# Patient Record
Sex: Female | Born: 1980 | Race: Black or African American | Hispanic: No | Marital: Single | Smoking: Current every day smoker
Health system: Southern US, Community
[De-identification: ages and names within clinical notes are randomized; demographics above are authoritative.]

---

## 2016-08-08 ENCOUNTER — Emergency Department (HOSPITAL_COMMUNITY): Payer: Medicaid Other

## 2016-08-08 ENCOUNTER — Emergency Department (HOSPITAL_COMMUNITY)
Admission: EM | Admit: 2016-08-08 | Discharge: 2016-08-08 | Disposition: A | Discharge status: Home / Self Care | Payer: Medicaid Other | Attending: Emergency Medicine | Admitting: Emergency Medicine

## 2016-08-08 ENCOUNTER — Encounter (HOSPITAL_COMMUNITY): Payer: Self-pay | Admitting: Neurology

## 2016-08-08 DIAGNOSIS — R1030 Lower abdominal pain, unspecified: Secondary | ICD-10-CM

## 2016-08-08 DIAGNOSIS — K5900 Constipation, unspecified: Secondary | ICD-10-CM | POA: Diagnosis not present

## 2016-08-08 DIAGNOSIS — F17200 Nicotine dependence, unspecified, uncomplicated: Secondary | ICD-10-CM | POA: Insufficient documentation

## 2016-08-08 DIAGNOSIS — R1031 Right lower quadrant pain: Secondary | ICD-10-CM | POA: Diagnosis present

## 2016-08-08 LAB — COMPREHENSIVE METABOLIC PANEL
ALBUMIN: 4.1 g/dL (ref 3.5–5.0)
ALT: 22 U/L (ref 14–54)
AST: 25 U/L (ref 15–41)
Alkaline Phosphatase: 66 U/L (ref 38–126)
Anion gap: 7 (ref 5–15)
BUN: 9 mg/dL (ref 6–20)
CHLORIDE: 104 mmol/L (ref 101–111)
CO2: 27 mmol/L (ref 22–32)
CREATININE: 0.95 mg/dL (ref 0.44–1.00)
Calcium: 9.6 mg/dL (ref 8.9–10.3)
GFR calc Af Amer: >60 mL/min (ref >60)
GLUCOSE: 94 mg/dL (ref 65–99)
POTASSIUM: 3.8 mmol/L (ref 3.5–5.1)
Sodium: 138 mmol/L (ref 135–145)
Total Bilirubin: 0.5 mg/dL (ref 0.3–1.2)
Total Protein: 7.7 g/dL (ref 6.5–8.1)

## 2016-08-08 LAB — CBC
HEMATOCRIT: 39.2 % (ref 36.0–46.0)
Hemoglobin: 12.9 g/dL (ref 12.0–15.0)
MCH: 25.4 pg — ABNORMAL LOW (ref 26.0–34.0)
MCHC: 32.9 g/dL (ref 30.0–36.0)
MCV: 77.3 fL — AB (ref 78.0–100.0)
PLATELETS: 246 10*3/uL (ref 150–400)
RBC: 5.07 MIL/uL (ref 3.87–5.11)
RDW: 15.6 % — AB (ref 11.5–15.5)
WBC: 6.3 10*3/uL (ref 4.0–10.5)

## 2016-08-08 LAB — LIPASE, BLOOD: LIPASE: 27 U/L (ref 11–51)

## 2016-08-08 LAB — I-STAT BETA HCG BLOOD, ED (MC, WL, AP ONLY): I-stat hCG, quantitative: <5 m[IU]/mL (ref <5)

## 2016-08-08 NOTE — ED Notes (Signed)
Patient changing into gown/pelvic cart at bedside if needed.

## 2016-08-08 NOTE — Discharge Instructions (Signed)
Drink plenty of fluids Take Miralax over the counter Follow up with OB

## 2016-08-08 NOTE — ED Notes (Signed)
Patient transported to Ultrasound 

## 2016-08-08 NOTE — ED Provider Notes (Signed)
[facility]-EM[facility]ENCY DEPT Provider Note   CSN: [id number] Arrival date & time: 08/08/16  1127  By signing my name below, I, Emmanuella Mensah, attest that this documentation has been prepared under the direction and in the presence of Wells FargoKelly Jilian West, PA-C. Electronically Signed: Angelene GiovanniEmmanuella Mensah, ED Scribe. 08/08/16. 2:28 PM.   History   Chief Complaint Chief Complaint  Patient presents with  . Abdominal Pain  . Vaginal Bleeding    HPI Comments: Julia Miller is a 35 y.o. female who presents to the Emergency Department complaining of gradual onset, persistent 6/10 right lower abdominal pain that radiates to her back onset yesterday morning. She reports associated light vaginal bleeding with one nickel sized clot yesterday and persistent nausea for one week. She adds that she normally has 2 BMs a day but lately, she has only been having one BM a day and it is hard and she has to strain. She explains that she had a positive home pregnancy test on October 30th and had vaginal bleeding for 1.5 days in November but she attributed those symptoms to her pregnancy and was not evaluated. Pt's LMP was in September 2017. No alleviating factors noted. Pt has not tried any medications PTA. She has NKDA. She reports a hx of miscarriage. She denies any known fevers, chills, vomiting, diarrhea, vaginal discharge, dysuria, urinary frequency, or any other symptoms. Pt has upcoming OBGYN appointment on 08/10/16.   The history is provided by the patient. No language interpreter was used.    History reviewed. No pertinent past medical history.  There are no active problems to display for this patient.   History reviewed. No pertinent surgical history.  OB History    No data available       Home Medications    Prior to Admission medications   Not on File    Family History No family history on file.  Social History Social History  Substance Use Topics  . Smoking status: Current Every Day Smoker    . Smokeless tobacco: Never Used  . Alcohol use Yes     Allergies   Patient has no known allergies.   Review of Systems Review of Systems  Constitutional: Negative for chills and fever.  Gastrointestinal: Positive for abdominal pain, constipation and nausea. Negative for diarrhea and vomiting.  Genitourinary: Positive for vaginal bleeding. Negative for dysuria, frequency, hematuria and vaginal discharge.  All other systems reviewed and are negative.    Physical Exam Updated Vital Signs BP 115/75 (BP Location: Left Arm)   Pulse 89   Temp 98.7 F (37.1 C) (Oral)   Resp 16   Ht 5\' 8"  (1.727 m)   Wt 200 lb (90.7 kg)   LMP 05/09/2016   SpO2 98%   BMI 30.41 kg/m   Physical Exam  Constitutional: She is oriented to person, place, and time. She appears well-developed and well-nourished. No distress.  HENT:  Head: Normocephalic and atraumatic.  Eyes: Conjunctivae and EOM are normal.  Neck: Neck supple.  Cardiovascular: Normal rate and regular rhythm.  Exam reveals no gallop and no friction rub.   No murmur heard. Pulmonary/Chest: Effort normal. No respiratory distress. She has no wheezes. She has no rales. She exhibits no tenderness.  Abdominal: Soft. Bowel sounds are normal. She exhibits no distension and no mass. There is tenderness. There is no rebound and no guarding. No hernia.  Mild right lower quadrant tenderness  Genitourinary:  Genitourinary Comments:  Normal external genitalia. No pain with speculum insertion. Closed cervical os  with normal appearance - no rash or lesions. Small amount of blood in vaginal vault consistent with a period. On bimanual examination no adnexal tenderness or cervical motion tenderness. No inguinal lymphadenopathy or inguinal hernia noted.Chaperone present during exam.    Musculoskeletal: Normal range of motion.  Neurological: She is alert and oriented to person, place, and time.  Skin: Skin is warm and dry.  Psychiatric: She has a normal  mood and affect. Her behavior is normal.  Nursing note and vitals reviewed.    ED Treatments / Results  DIAGNOSTIC STUDIES: Oxygen Saturation is 98% on RA, normal by my interpretation.    COORDINATION OF CARE: 2:25 PM- Pt advised of plan for treatment and pt agrees. Pt informed of her pregnancy test results and lab results. She will receive pelvic examination, US pelvis, US transvaginal, and US doppler for further evaluation.    Labs (all labs ordered are listed, but only abnormal results are displayed) Labs Reviewed  CBC - Abnormal; Notable for the following:       Result Value   MCV 77.3 (*)    MCH 25.4 (*)    RDW 15.6 (*)    All other components within normal limits  LIPASE, BLOOD  COMPREHENSIVE METABOLIC PANEL  URINALYSIS, ROUTINE W REFLEX MICROSCOPIC  I-STAT BETA HCG BLOOD, ED ([facility], [facility], [facility] ONLY)    EKG  EKG Interpretation None       Radiology US Transvaginal Non-ob  Result Date: 08/08/2016 CLINICAL DATA:  Acute right pelvic pain for 1 day. Clinical suspicion for ovarian torsion. EXAM: TRANSABDOMINAL AND TRANSVAGINAL ULTRASOUND OF PELVIS DOPPLER ULTRASOUND OF OVARIES TECHNIQUE: Both transabdominal and transvaginal ultrasound examinations of the pelvis were performed. Transabdominal technique was performed for global imaging of the pelvis including uterus, ovaries, adnexal regions, and pelvic cul-de-sac. It was necessary to proceed with endovaginal exam following the transabdominal exam to visualize the endometrium and ovaries. Color and duplex Doppler ultrasound was utilized to evaluate blood flow to the ovaries. COMPARISON:  None. FINDINGS: Uterus Measurements: 10.0 x 5.3 x 6.0 cm. No fibroids or other mass visualized. Endometrium Thickness: 4 mm.  No focal abnormality visualized. Right ovary Measurements: 3.4 x 1.6 x 3.8 cm. Normal appearance/no adnexal mass. Left ovary Measurements: 3.1 x 1.7 by 2.2 cm. Normal appearance/no adnexal mass. Pulsed Doppler evaluation of both  ovaries demonstrates normal low-resistance arterial and venous waveforms. Other findings No abnormal free fluid. IMPRESSION: Normal appearance of uterus and both ovaries. No pelvic mass or other significant abnormality identified. No sonographic evidence for ovarian torsion. Electronically Signed   By: Myles Rosenthal M.D.   On: 08/08/2016 16:35   US Pelvis Complete  Result Date: 08/08/2016 CLINICAL DATA:  Acute right pelvic pain for 1 day. Clinical suspicion for ovarian torsion. EXAM: TRANSABDOMINAL AND TRANSVAGINAL ULTRASOUND OF PELVIS DOPPLER ULTRASOUND OF OVARIES TECHNIQUE: Both transabdominal and transvaginal ultrasound examinations of the pelvis were performed. Transabdominal technique was performed for global imaging of the pelvis including uterus, ovaries, adnexal regions, and pelvic cul-de-sac. It was necessary to proceed with endovaginal exam following the transabdominal exam to visualize the endometrium and ovaries. Color and duplex Doppler ultrasound was utilized to evaluate blood flow to the ovaries. COMPARISON:  None. FINDINGS: Uterus Measurements: 10.0 x 5.3 x 6.0 cm. No fibroids or other mass visualized. Endometrium Thickness: 4 mm.  No focal abnormality visualized. Right ovary Measurements: 3.4 x 1.6 x 3.8 cm. Normal appearance/no adnexal mass. Left ovary Measurements: 3.1 x 1.7 by 2.2 cm. Normal appearance/no adnexal mass. Pulsed  Doppler evaluation of both ovaries demonstrates normal low-resistance arterial and venous waveforms. Other findings No abnormal free fluid. IMPRESSION: Normal appearance of uterus and both ovaries. No pelvic mass or other significant abnormality identified. No sonographic evidence for ovarian torsion. Electronically Signed   By: Myles RosenthalJohn  Stahl M.D.   On: 08/08/2016 16:35   [place] Art/ven Flow Abd Pelv Doppler  Result Date: 08/08/2016 CLINICAL DATA:  Acute right pelvic pain for 1 day. Clinical suspicion for ovarian torsion. EXAM: TRANSABDOMINAL AND TRANSVAGINAL ULTRASOUND OF  PELVIS DOPPLER ULTRASOUND OF OVARIES TECHNIQUE: Both transabdominal and transvaginal ultrasound examinations of the pelvis were performed. Transabdominal technique was performed for global imaging of the pelvis including uterus, ovaries, adnexal regions, and pelvic cul-de-sac. It was necessary to proceed with endovaginal exam following the transabdominal exam to visualize the endometrium and ovaries. Color and duplex Doppler ultrasound was utilized to evaluate blood flow to the ovaries. COMPARISON:  None. FINDINGS: Uterus Measurements: 10.0 x 5.3 x 6.0 cm. No fibroids or other mass visualized. Endometrium Thickness: 4 mm.  No focal abnormality visualized. Right ovary Measurements: 3.4 x 1.6 x 3.8 cm. Normal appearance/no adnexal mass. Left ovary Measurements: 3.1 x 1.7 by 2.2 cm. Normal appearance/no adnexal mass. Pulsed Doppler evaluation of both ovaries demonstrates normal low-resistance arterial and venous waveforms. Other findings No abnormal free fluid. IMPRESSION: Normal appearance of uterus and both ovaries. No pelvic mass or other significant abnormality identified. No sonographic evidence for ovarian torsion. Electronically Signed   By: Myles RosenthalJohn  Stahl M.D.   On: 08/08/2016 16:35    Procedures Procedures (including critical care time)  Medications Ordered in ED Medications - No data to display   Initial Impression / Assessment and Plan / ED Course  Terance HartKelly Allyiah Gartner, PA-C has reviewed the triage vital signs and the nursing notes.  Pertinent labs & imaging results that were available during my care of the patient were reviewed by me and considered in my medical decision making (see chart for details).  Clinical Course    Patient is nontoxic, nonseptic appearing, in no apparent distress. Labs, imaging and vitals reviewed. Vitals are normal. Patient does not meet the SIRS or Sepsis criteria. She has a soft, minimally tender abdomen. No indication of appendicitis, bowel obstruction, bowel perforation,  cholecystitis, diverticulitis, PID or ectopic pregnancy.  Negative pregnancy test. Labs are unremarkable. US negative for acute pathology. Return precautions discussed. Patient expresses understanding and agrees with plan.  Final Clinical Impressions(s) / ED Diagnoses   Final diagnoses:  Lower abdominal pain  Constipation, unspecified constipation type    New Prescriptions There are no discharge medications for this patient.  I personally performed the services described in this documentation, which was scribed in my presence. The recorded information has been reviewed and is accurate.    Bethel BornKelly Marie Glennie Rodda, PA-C 08/09/16 [id number]    Lyndal Pulleyaniel Knott, MD 08/09/16 [id number]

## 2016-08-08 NOTE — ED Notes (Signed)
Pt states she had a positive pregnancy test in the end of October and has an OB appt on 12/20.

## 2016-08-08 NOTE — ED Triage Notes (Addendum)
Pt here with right sided abdominal pain x 2 days, with vaginal bleeding since yesterday. LMP was September, had positive pregnancy test. Is not sure if she is having a miscarriage. Has been having nausea. Pt in NAD, resting comfortably in chair.

## 2017-10-03 ENCOUNTER — Encounter (HOSPITAL_COMMUNITY): Payer: Self-pay | Admitting: Family Medicine

## 2017-10-03 ENCOUNTER — Ambulatory Visit (INDEPENDENT_AMBULATORY_CARE_PROVIDER_SITE_OTHER): Payer: PRIVATE HEALTH INSURANCE

## 2017-10-03 ENCOUNTER — Ambulatory Visit (HOSPITAL_COMMUNITY)
Admission: EM | Admit: 2017-10-03 | Discharge: 2017-10-03 | Disposition: A | Discharge status: Home / Self Care | Payer: PRIVATE HEALTH INSURANCE | Attending: Family Medicine | Admitting: Family Medicine

## 2017-10-03 DIAGNOSIS — M25531 Pain in right wrist: Secondary | ICD-10-CM

## 2017-10-03 DIAGNOSIS — S63501A Unspecified sprain of right wrist, initial encounter: Secondary | ICD-10-CM | POA: Diagnosis not present

## 2017-10-03 MED ORDER — DICLOFENAC SODIUM 75 MG PO TBEC: 75.0000 mg | DELAYED_RELEASE_TABLET | Freq: Two times a day (BID) | ORAL | 0 refills | Status: AC

## 2017-10-03 NOTE — ED Provider Notes (Signed)
  [facility]   [id number] 10/03/17 Arrival Time: 1127  ASSESSMENT & PLAN:  1. Sprain of right wrist, initial encounter    Imaging: Dg Wrist Complete Right  Result Date: 10/03/2017 CLINICAL DATA:  Pain following fall EXAM: RIGHT WRIST - COMPLETE 3+ VIEW COMPARISON:  None. FINDINGS: Frontal, oblique, lateral, and ulnar deviation scaphoid images were obtained. There is no evident fracture or dislocation. Joint spaces appear normal. No erosive change. IMPRESSION: No fracture or dislocation.  No evident arthropathy. Electronically Signed   By: Bretta BangWilliam  Woodruff III M.D.   On: 10/03/2017 12:07   Meds ordered this encounter  Medications  . diclofenac (VOLTAREN) 75 MG EC tablet    Sig: Take 1 tablet (75 mg total) by mouth 2 (two) times daily.    Dispense:  14 tablet    Refill:  0   Ice. ROM as tolerated. Will f/u if not improving over the next several days.  Reviewed expectations re: course of current medical issues. Questions answered. Outlined signs and symptoms indicating need for more acute intervention. Patient verbalized understanding. After Visit Summary given.  SUBJECTIVE: History from: patient. Lucilla LameBrandice C Mehring is a 37 y.o. female who reports localized mild pain of her right wrist that is stable; persistent; described as aching and dull without radiation. Onset: abrupt, yesterday. Injury/trama: yes, reports falling on outstretched hand. Relieved by: "holding still". Worsened by: certain movements. Associated symptoms: none reported. Extremity sensation changes or weakness: none. Self treatment: Tylenol with mild help.  History of similar: no  ROS: As per HPI.   OBJECTIVE:  Vitals:   10/03/17 1151  BP: 121/80  Pulse: 80  Resp: 18  Temp: 98.2 F (36.8 C)  SpO2: 100%    General appearance: alert; no distress Extremities: no cyanosis or edema; symmetrical with no gross deformities; localized tenderness over her right radial wrist with no swelling and no  bruising; ROM: normal CV: normal extremity capillary refill Skin: warm and dry Neurologic: normal gait; normal symmetric reflexes in all extremities; normal sensation in all extremities Psychological: alert and cooperative; normal mood and affect  No Known Allergies   Social History   Socioeconomic History  . Marital status: Single    Spouse name: Not on file  . Number of children: Not on file  . Years of education: Not on file  . Highest education level: Not on file  Social Needs  . Financial resource strain: Not on file  . Food insecurity - worry: Not on file  . Food insecurity - inability: Not on file  . Transportation needs - medical: Not on file  . Transportation needs - non-medical: Not on file  Occupational History  . Not on file  Tobacco Use  . Smoking status: Current Every Day Smoker  . Smokeless tobacco: Never Used  Substance and Sexual Activity  . Alcohol use: Yes  . Drug use: Not on file  . Sexual activity: Not on file  Other Topics Concern  . Not on file  Social History Narrative  . Not on file   History reviewed. No pertinent surgical history.    Mardella LaymanHagler, Deziya Amero, MD 10/03/17 [phone]

## 2017-10-03 NOTE — ED Triage Notes (Signed)
Pt here for possible sprain to right wrist since falling last night and catching herself with right arm.

## 2018-10-02 IMAGING — DX DG WRIST COMPLETE 3+V*R*
4 series · 4 of 4 positions shown · non-contrast
Comparison: None.

CLINICAL DATA: Pain following fall

EXAM:
RIGHT WRIST - COMPLETE 3+ VIEW

[wrist pa]
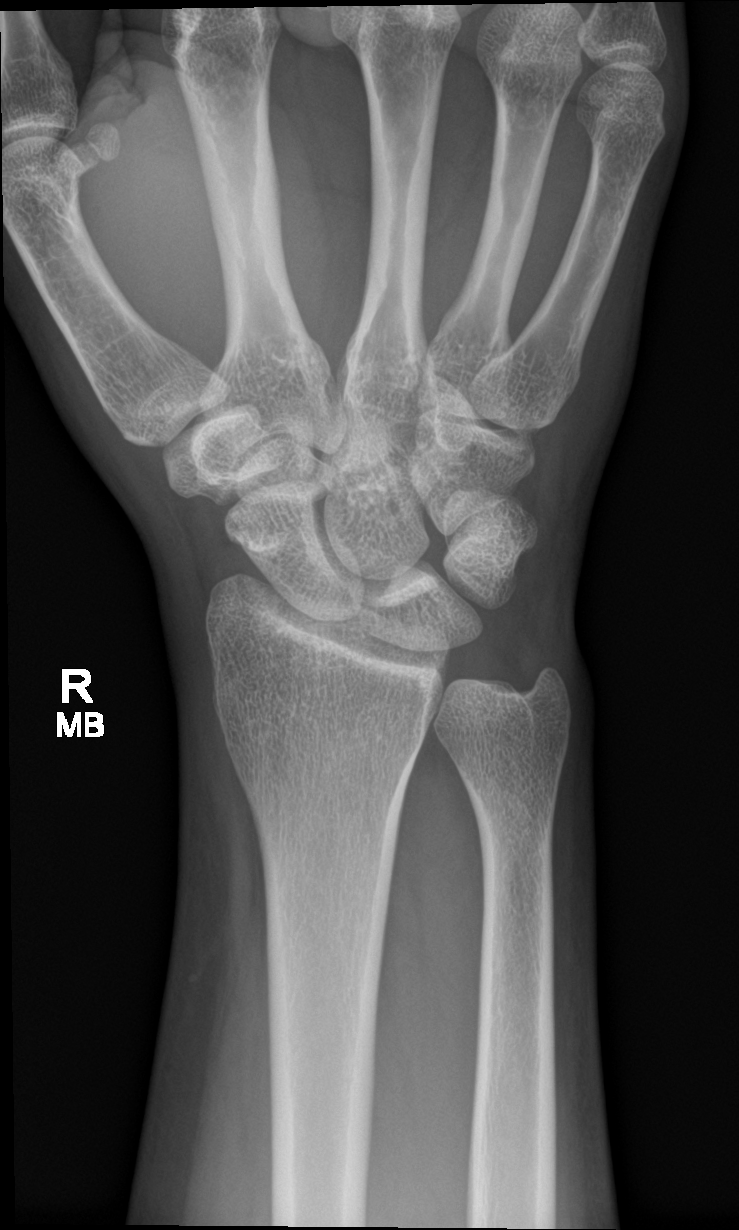

[wrist navicular]
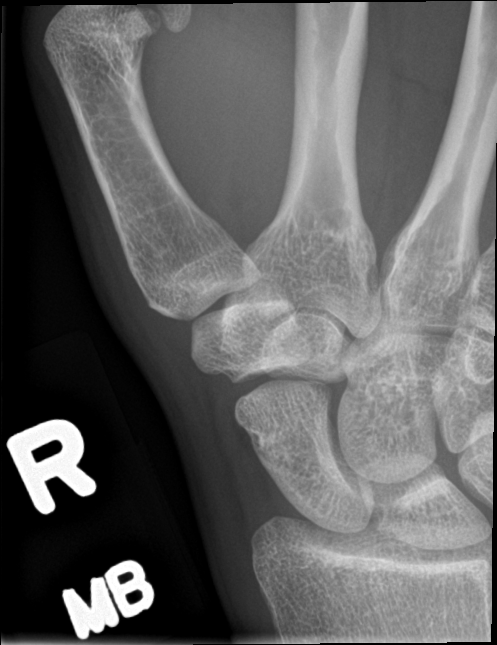

[wrist obl]
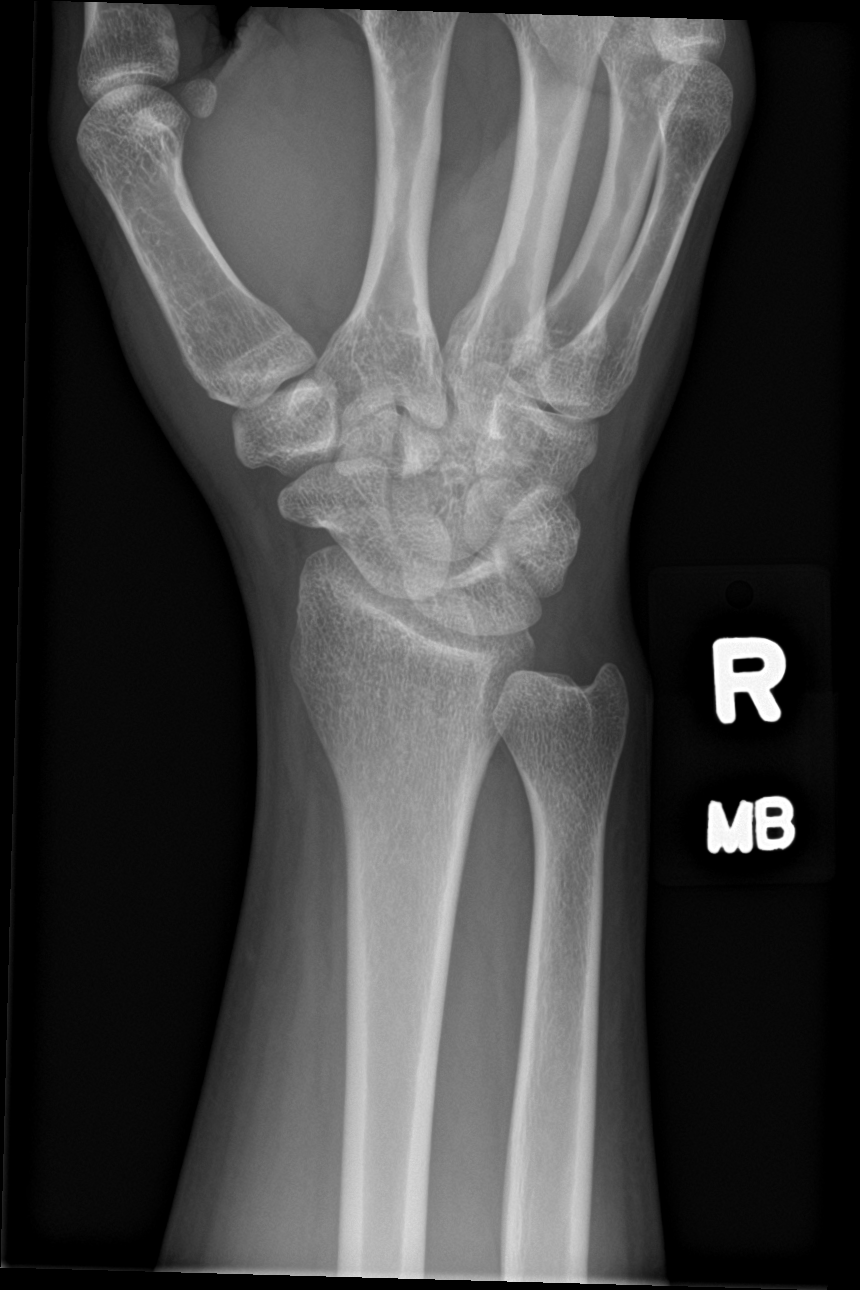

[wrist lat]
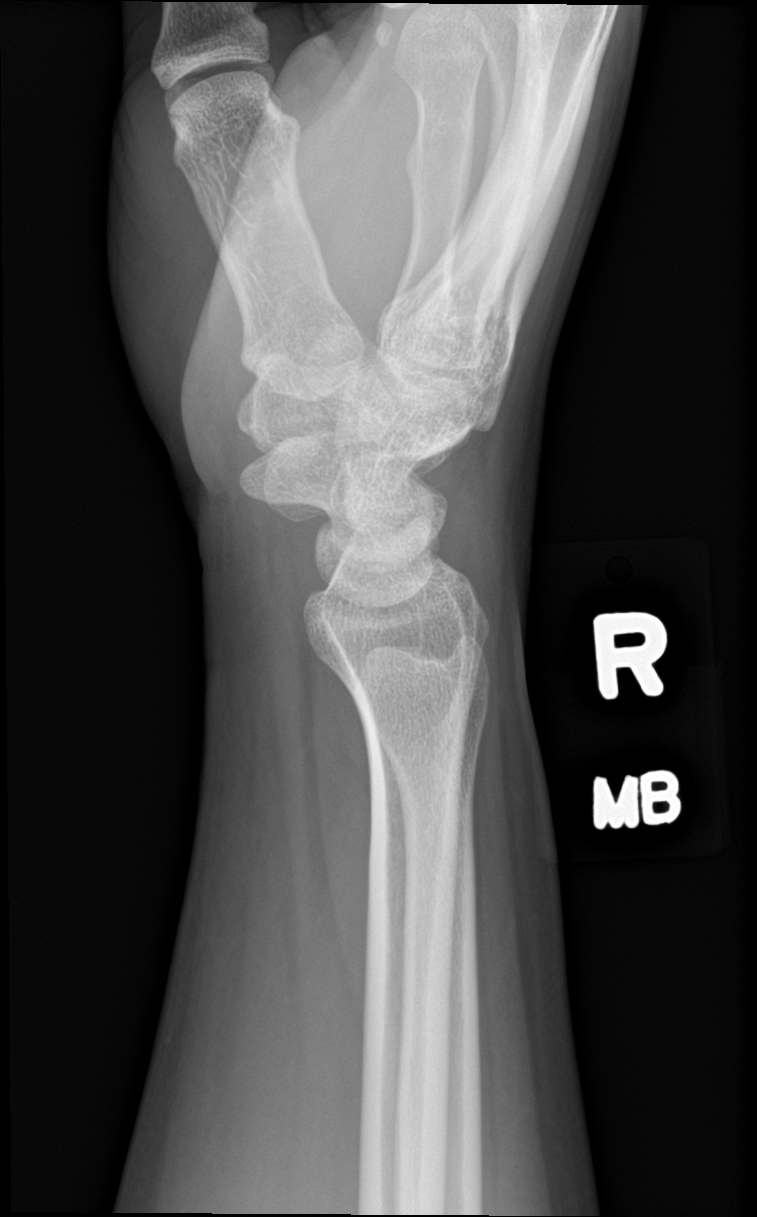

[4 of 4 positions shown; findings below may reference images not displayed]

FINDINGS: Frontal, oblique, lateral, and ulnar deviation scaphoid images were
obtained. There is no evident fracture or dislocation. Joint spaces
appear normal. No erosive change.
IMPRESSION: No fracture or dislocation.  No evident arthropathy.
